# Patient Record
Sex: Male | Born: 1935 | Marital: Married
Health system: Southern US, Community
[De-identification: ages and names within clinical notes are randomized; demographics above are authoritative.]

## PROBLEM LIST (undated history)

## (undated) DIAGNOSIS — I5023 Acute on chronic systolic (congestive) heart failure: Secondary | ICD-10-CM

## (undated) DIAGNOSIS — J449 Chronic obstructive pulmonary disease, unspecified: Secondary | ICD-10-CM

## (undated) DIAGNOSIS — N189 Chronic kidney disease, unspecified: Secondary | ICD-10-CM

## (undated) DIAGNOSIS — I10 Essential (primary) hypertension: Secondary | ICD-10-CM

## (undated) DIAGNOSIS — I255 Ischemic cardiomyopathy: Secondary | ICD-10-CM

## (undated) DIAGNOSIS — I214 Non-ST elevation (NSTEMI) myocardial infarction: Secondary | ICD-10-CM

## (undated) DIAGNOSIS — M1990 Unspecified osteoarthritis, unspecified site: Secondary | ICD-10-CM

## (undated) DIAGNOSIS — Z930 Tracheostomy status: Secondary | ICD-10-CM

## (undated) DIAGNOSIS — N400 Enlarged prostate without lower urinary tract symptoms: Secondary | ICD-10-CM

## (undated) DIAGNOSIS — E785 Hyperlipidemia, unspecified: Secondary | ICD-10-CM

---

## 2014-07-20 ENCOUNTER — Institutional Professional Consult (permissible substitution)
Admission: AD | Admit: 2014-07-20 | Discharge: 2014-08-27 | Disposition: E | Payer: Medicare Other | Source: Ambulatory Visit | Attending: Internal Medicine | Admitting: Internal Medicine

## 2014-07-20 ENCOUNTER — Other Ambulatory Visit (HOSPITAL_COMMUNITY): Payer: Self-pay

## 2014-07-20 DIAGNOSIS — I469 Cardiac arrest, cause unspecified: Secondary | ICD-10-CM

## 2014-07-20 DIAGNOSIS — J9690 Respiratory failure, unspecified, unspecified whether with hypoxia or hypercapnia: Secondary | ICD-10-CM

## 2014-07-20 DIAGNOSIS — I639 Cerebral infarction, unspecified: Secondary | ICD-10-CM

## 2014-07-20 DIAGNOSIS — R198 Other specified symptoms and signs involving the digestive system and abdomen: Secondary | ICD-10-CM | POA: Insufficient documentation

## 2014-07-20 DIAGNOSIS — Z931 Gastrostomy status: Secondary | ICD-10-CM

## 2014-07-20 DIAGNOSIS — Z789 Other specified health status: Secondary | ICD-10-CM

## 2014-07-20 DIAGNOSIS — R509 Fever, unspecified: Secondary | ICD-10-CM

## 2014-07-20 DIAGNOSIS — I509 Heart failure, unspecified: Secondary | ICD-10-CM

## 2014-07-20 HISTORY — DX: Acute on chronic systolic (congestive) heart failure: I50.23

## 2014-07-20 HISTORY — DX: Ischemic cardiomyopathy: I25.5

## 2014-07-20 HISTORY — DX: Non-ST elevation (NSTEMI) myocardial infarction: I21.4

## 2014-07-20 HISTORY — DX: Chronic obstructive pulmonary disease, unspecified: J44.9

## 2014-07-20 HISTORY — DX: Benign prostatic hyperplasia without lower urinary tract symptoms: N40.0

## 2014-07-20 HISTORY — DX: Chronic kidney disease, unspecified: N18.9

## 2014-07-20 HISTORY — DX: Essential (primary) hypertension: I10

## 2014-07-20 HISTORY — DX: Unspecified osteoarthritis, unspecified site: M19.90

## 2014-07-20 HISTORY — DX: Tracheostomy status: Z93.0

## 2014-07-20 HISTORY — DX: Hyperlipidemia, unspecified: E78.5

## 2014-07-21 ENCOUNTER — Encounter: Payer: Self-pay | Admitting: Physician Assistant

## 2014-07-21 ENCOUNTER — Other Ambulatory Visit (HOSPITAL_COMMUNITY): Payer: Self-pay

## 2014-07-21 DIAGNOSIS — I509 Heart failure, unspecified: Secondary | ICD-10-CM | POA: Insufficient documentation

## 2014-07-21 DIAGNOSIS — Z931 Gastrostomy status: Secondary | ICD-10-CM | POA: Insufficient documentation

## 2014-07-21 DIAGNOSIS — I5020 Unspecified systolic (congestive) heart failure: Secondary | ICD-10-CM | POA: Diagnosis not present

## 2014-07-21 DIAGNOSIS — Z789 Other specified health status: Secondary | ICD-10-CM | POA: Insufficient documentation

## 2014-07-21 LAB — CBC WITH DIFFERENTIAL/PLATELET
Basophils Absolute: 0 10*3/uL (ref 0.0–0.1)
Basophils Relative: 0 % (ref 0–1)
EOS ABS: 0.1 10*3/uL (ref 0.0–0.7)
Eosinophils Relative: 1 % (ref 0–5)
HCT: 33.6 % — ABNORMAL LOW (ref 39.0–52.0)
HEMOGLOBIN: 10.5 g/dL — AB (ref 13.0–17.0)
LYMPHS PCT: 13 % (ref 12–46)
Lymphs Abs: 1.6 10*3/uL (ref 0.7–4.0)
MCH: 29.1 pg (ref 26.0–34.0)
MCHC: 31.3 g/dL (ref 30.0–36.0)
MCV: 93.1 fL (ref 78.0–100.0)
MONOS PCT: 12 % (ref 3–12)
Monocytes Absolute: 1.6 10*3/uL — ABNORMAL HIGH (ref 0.1–1.0)
NEUTROS PCT: 74 % (ref 43–77)
Neutro Abs: 9.6 10*3/uL — ABNORMAL HIGH (ref 1.7–7.7)
PLATELETS: 286 10*3/uL (ref 150–400)
RBC: 3.61 MIL/uL — AB (ref 4.22–5.81)
RDW: 16.5 % — ABNORMAL HIGH (ref 11.5–15.5)
WBC: 12.9 10*3/uL — AB (ref 4.0–10.5)

## 2014-07-21 LAB — COMPREHENSIVE METABOLIC PANEL
ALBUMIN: 2.8 g/dL — AB (ref 3.5–5.2)
ALT: 6 U/L (ref 0–53)
AST: 22 U/L (ref 0–37)
Alkaline Phosphatase: 72 U/L (ref 39–117)
Anion gap: 13 (ref 5–15)
BILIRUBIN TOTAL: 0.8 mg/dL (ref 0.3–1.2)
BUN: 70 mg/dL — ABNORMAL HIGH (ref 6–23)
CHLORIDE: 103 meq/L (ref 96–112)
CO2: 35 mEq/L — ABNORMAL HIGH (ref 19–32)
CREATININE: 1.65 mg/dL — AB (ref 0.50–1.35)
Calcium: 8.9 mg/dL (ref 8.4–10.5)
GFR calc Af Amer: 43 mL/min — ABNORMAL LOW (ref >90)
GFR calc non Af Amer: 37 mL/min — ABNORMAL LOW (ref >90)
Glucose, Bld: 92 mg/dL (ref 70–99)
Potassium: 3.7 mEq/L (ref 3.7–5.3)
SODIUM: 151 meq/L — AB (ref 137–147)
Total Protein: 7.1 g/dL (ref 6.0–8.3)

## 2014-07-21 LAB — PHOSPHORUS: Phosphorus: 3.7 mg/dL (ref 2.3–4.6)

## 2014-07-21 LAB — BLOOD GAS, ARTERIAL
Acid-Base Excess: 10.6 mmol/L — ABNORMAL HIGH (ref 0.0–2.0)
Bicarbonate: 34.4 mEq/L — ABNORMAL HIGH (ref 20.0–24.0)
FIO2: 0.4 %
O2 Saturation: 94.2 %
PCO2 ART: 44.4 mmHg (ref 35.0–45.0)
Patient temperature: 98.6
TCO2: 35.8 mmol/L (ref 0–100)
pH, Arterial: 7.502 — ABNORMAL HIGH (ref 7.350–7.450)
pO2, Arterial: 70.7 mmHg — ABNORMAL LOW (ref 80.0–100.0)

## 2014-07-21 LAB — HEMOGLOBIN A1C
Hgb A1c MFr Bld: 5.4 % (ref <5.7)
Mean Plasma Glucose: 108 mg/dL (ref <117)

## 2014-07-21 LAB — PROCALCITONIN: Procalcitonin: 0.18 ng/mL

## 2014-07-21 LAB — LIPID PANEL
CHOLESTEROL: 96 mg/dL (ref 0–200)
HDL: 38 mg/dL — ABNORMAL LOW (ref >39)
LDL Cholesterol: 37 mg/dL (ref 0–99)
Total CHOL/HDL Ratio: 2.5 RATIO
Triglycerides: 103 mg/dL (ref <150)
VLDL: 21 mg/dL (ref 0–40)

## 2014-07-21 LAB — IRON AND TIBC
Iron: 21 ug/dL — ABNORMAL LOW (ref 42–135)
Saturation Ratios: 11 % — ABNORMAL LOW (ref 20–55)
TIBC: 184 ug/dL — ABNORMAL LOW (ref 215–435)
UIBC: 163 ug/dL (ref 125–400)

## 2014-07-21 LAB — CK TOTAL AND CKMB (NOT AT ARMC)
CK, MB: 3.3 ng/mL (ref 0.3–4.0)
RELATIVE INDEX: INVALID (ref 0.0–2.5)
Total CK: 24 U/L (ref 7–232)

## 2014-07-21 LAB — VITAMIN B12: Vitamin B-12: 1042 pg/mL — ABNORMAL HIGH (ref 211–911)

## 2014-07-21 LAB — TROPONIN I: Troponin I: <0.3 ng/mL (ref <0.30)

## 2014-07-21 LAB — TSH: TSH: 8.97 u[IU]/mL — ABNORMAL HIGH (ref 0.350–4.500)

## 2014-07-21 LAB — PROTIME-INR
INR: 1.37 (ref 0.00–1.49)
Prothrombin Time: 17 seconds — ABNORMAL HIGH (ref 11.6–15.2)

## 2014-07-21 LAB — MAGNESIUM: MAGNESIUM: 1.9 mg/dL (ref 1.5–2.5)

## 2014-07-21 LAB — PRO B NATRIURETIC PEPTIDE: PRO B NATRI PEPTIDE: 65464 pg/mL — AB (ref 0–450)

## 2014-07-21 LAB — FOLATE RBC: RBC FOLATE: 978 ng/mL — AB (ref >280)

## 2014-07-21 LAB — T4, FREE: Free T4: 1.21 ng/dL (ref 0.80–1.80)

## 2014-07-21 LAB — FERRITIN: Ferritin: 519 ng/mL — ABNORMAL HIGH (ref 22–322)

## 2014-07-21 MED ORDER — IOHEXOL 300 MG/ML  SOLN
50.0000 mL | Freq: Once | INTRAMUSCULAR | Status: AC | PRN
  Administered 2014-07-21: 50 mL via ORAL

## 2014-07-21 NOTE — Consult Note (Signed)
Name: Luis Thornton MRN: [id number] DOB: 21-Jun-1931    ADMISSION DATE:  07/07/2014 CONSULTATION DATE:  07/21/2014  REFERRING MD :  Shawnie Pons  CHIEF COMPLAINT:  Tracheostomy dependence  BRIEF PATIENT DESCRIPTION: 80 y.o. M with PMH of COPD, ischemic cardiomyopathy, HLD, HTN, CKD who was recently admitted to [facility] 10/16 for NSTEMI and went on to have CABG 11/20.  Hospitalization complicated by acute respiratory failure requiring multiple intubations and eventual trach 11/13 as well as CKD requiring HD.  Transferred to [facility] 11/24, doing well on 35% ATC.  PCCM consulted.  SIGNIFICANT EVENTS  10/16 - admitted to [facility] 10/20 - CABG 11/13 - trach / PEG 11/18 - PPM placed 11/24 - transfer to [facility] 11/25 - PCCM consulted  STUDIES:  11/24 CXR >>> CHF  HISTORY OF PRESENT ILLNESS:  Pt is non-verbal; therefore, this HPI is obtained from chart review. Luis Thornton is an 80 y.o. M with PMH of COPD, chronic systolic dysfunction with ischemic cardiomyopathy, HLD, HTN, CKD (on recent HD via temporary catheter), BPH, Arthritis.  He was admitted to [facility] on 10/16 with chest pain and found to have NSTEMI.  He went on to have a CABG 10/20.  Hospitalization was complicated by acute respiratory failure and acute renal failure.  He was intubated twice and failed extubation requiring a 3rd intubation.  He subsequently required tracheostomy and PEG which were performed 11/13.  He had been on HD via temporary catheter for several weeks and was being followed by nephrology in case he were to require long term HD. He had a pacemaker placed on 11/18 for heart failure. Since trach was placed, pt has been able to be weaned off the vent and was doing well on 40% ATC.  On 11/24, he weaned further to 35% and was cleared for discharge from [facility].  Due to all of his medical problems LTAC was required therefore pt was transferred to [facility] on 11/24. PCCM  was consulted for assistance with medical management.  PAST MEDICAL HISTORY :   has no past medical history on file.  has no past surgical history on file. Prior to Admission medications   Not on File   Allergies not on file  FAMILY HISTORY:  family history is not on file. SOCIAL HISTORY:    REVIEW OF SYSTEMS:  Unable to obtain as pt is non-verbal.  SUBJECTIVE:   VITAL SIGNS:  Vitals reviewed, stable.    PHYSICAL EXAMINATION: General: Chronically ill appearing, resting in bed, in NAD. Neuro: Awake, follows commands intermittently, non-verbal.  HEENT: [place]/AT. PERRL, sclerae anicteric.  Trach collar in place. Cardiovascular: RRR, no M/R/G.  Lungs: Diminished, few scattered rhonchi.  On 35% ATC. Abdomen: PEG in place, +BS, soft, NT/ND.  Musculoskeletal: No gross deformities, no edema.  Skin: Intact, warm, no rashes.   Recent Labs Lab 07/21/14 0500  NA 151*  K 3.7  CL 103  CO2 35*  BUN 70*  CREATININE 1.65*  GLUCOSE 92    Recent Labs Lab 07/21/14 0500  HGB 10.5*  HCT 33.6*  WBC 12.9*  PLT 286   Dg Chest Port 1 View  07/13/2014   CLINICAL DATA:  CHF  EXAM: PORTABLE CHEST - 1 VIEW  COMPARISON:  07/18/2014  FINDINGS: Bilateral interstitial and alveolar airspace opacities. Bilateral small pleural effusions. No pneumothorax. Stable cardiomegaly. Prior CABG.  3 lead cardiac pacer. Tracheostomy to with the tip 2.7 cm above the carina. Left subclavian central venous catheter  with the tip projecting along the confluence of the brachiocephalic vein and SVC.  Unremarkable osseous structures.  IMPRESSION: Overall findings concerning for worsening CHF.   Electronically Signed   By: Kathreen Devoid   On: 07/22/2014 16:54   Dg Abd Portable 1v  07/21/2014   CLINICAL DATA:  Percutaneous gastrostomy tube.  EXAM: PORTABLE ABDOMEN - 1 VIEW  COMPARISON:  KUB from yesterday  FINDINGS: Contrast was injected into the percutaneous gastrostomy tube, with contrast outlining of the stomach and  duodenum. Contrast within the colon was noted on the prior study. No evidence of peritoneal leakage.The bowel gas pattern is nonobstructive.  IMPRESSION: Intraluminal percutaneous gastrostomy tube.   Electronically Signed   By: Jorje Guild M.D.   On: 07/21/2014 02:43   Dg Abd Portable 1v  07/03/2014   CLINICAL DATA:  PEG tube.  EXAM: PORTABLE ABDOMEN - 1 VIEW  COMPARISON:  07/18/2014  FINDINGS: Percutaneous gastrostomy tube tip overlaps the left upper quadrant. Cannot confirm intraluminal location with contrast injection.  Oral contrast outlines colon. There is no evidence of bowel obstruction.  Bibasilar lung opacities - there has been recent dedicated chest imaging.  IMPRESSION: Percutaneous gastrostomy tube tip projects over the left upper quadrant.   Electronically Signed   By: Jorje Guild M.D.   On: 07/16/2014 21:10    ASSESSMENT / PLAN:  Chronic respiratory failure s/p tracheostomy 11/13 Tracheostomy Dependence - doing well on 35% ATC COPD without evidence of exacerbation Mild pulmonary edema Small bilateral pleural effusions Recs: Continue ATC as tolerated, NO downsize planned - benefit? He does not speak, PMV unlikely to be beneficial Continue scheduled DuoNebs / PRN Abluterol. No role systemic steroids or abx. Continue lasix gtt per primary, with correction NA free water and close observation crt / BUN Follow CXR intermittently. ABG reviewed, neuro driven alk?  Concerned that his quality of life will be horrible, would consider addressing code status   Montey Hora, [place] Pgr: [phone]  or [phone] 07/21/2014, 10:47 AM   STAFF NOTE: I, Merrie Roof, MD FACP have personally reviewed patient's available data, including medical history, events of note, physical examination and test results as part of my evaluation. I have discussed with resident/NP and other care providers such as pharmacist, RN and RRT. In  addition, I personally evaluated patient and elicited key findings of: limited alertness when I examined him, continue trach collar, monitor sputum, no change in trach for now, no PMV unless able to improve neuro status, correct Na, diuresis as tolerated per renal fxn  Lavon Paganini. Titus Mould, MD, [place] Pgr: [facility] 07/21/2014 3:41 PM '

## 2014-07-22 LAB — BLOOD GAS, ARTERIAL
ACID-BASE EXCESS: 14 mmol/L — AB (ref 0.0–2.0)
Bicarbonate: 38.3 mEq/L — ABNORMAL HIGH (ref 20.0–24.0)
FIO2: 0.35 %
O2 SAT: 96.6 %
PATIENT TEMPERATURE: 98.6
TCO2: 39.7 mmol/L (ref 0–100)
pCO2 arterial: 48.5 mmHg — ABNORMAL HIGH (ref 35.0–45.0)
pH, Arterial: 7.508 — ABNORMAL HIGH (ref 7.350–7.450)
pO2, Arterial: 80.8 mmHg (ref 80.0–100.0)

## 2014-07-22 LAB — CBC
HCT: 33 % — ABNORMAL LOW (ref 39.0–52.0)
HEMOGLOBIN: 10.1 g/dL — AB (ref 13.0–17.0)
MCH: 28.6 pg (ref 26.0–34.0)
MCHC: 30.6 g/dL (ref 30.0–36.0)
MCV: 93.5 fL (ref 78.0–100.0)
Platelets: 274 10*3/uL (ref 150–400)
RBC: 3.53 MIL/uL — ABNORMAL LOW (ref 4.22–5.81)
RDW: 17.1 % — ABNORMAL HIGH (ref 11.5–15.5)
WBC: 11.3 10*3/uL — ABNORMAL HIGH (ref 4.0–10.5)

## 2014-07-22 LAB — BASIC METABOLIC PANEL
ANION GAP: 10 (ref 5–15)
BUN: 66 mg/dL — ABNORMAL HIGH (ref 6–23)
CO2: 39 mEq/L — ABNORMAL HIGH (ref 19–32)
Calcium: 8.6 mg/dL (ref 8.4–10.5)
Chloride: 104 mEq/L (ref 96–112)
Creatinine, Ser: 1.53 mg/dL — ABNORMAL HIGH (ref 0.50–1.35)
GFR calc Af Amer: 47 mL/min — ABNORMAL LOW (ref >90)
GFR, EST NON AFRICAN AMERICAN: 41 mL/min — AB (ref >90)
GLUCOSE: 95 mg/dL (ref 70–99)
POTASSIUM: 3.2 meq/L — AB (ref 3.7–5.3)
SODIUM: 153 meq/L — AB (ref 137–147)

## 2014-07-22 LAB — PHOSPHORUS: PHOSPHORUS: 3.6 mg/dL (ref 2.3–4.6)

## 2014-07-22 LAB — PROTIME-INR
INR: 1.35 (ref 0.00–1.49)
Prothrombin Time: 16.8 seconds — ABNORMAL HIGH (ref 11.6–15.2)

## 2014-07-22 LAB — MAGNESIUM: MAGNESIUM: 1.8 mg/dL (ref 1.5–2.5)

## 2014-07-23 LAB — BASIC METABOLIC PANEL
Anion gap: 11 (ref 5–15)
BUN: 67 mg/dL — AB (ref 6–23)
CHLORIDE: 104 meq/L (ref 96–112)
CO2: 40 meq/L — AB (ref 19–32)
Calcium: 8.7 mg/dL (ref 8.4–10.5)
Creatinine, Ser: 1.46 mg/dL — ABNORMAL HIGH (ref 0.50–1.35)
GFR calc Af Amer: 50 mL/min — ABNORMAL LOW (ref >90)
GFR calc non Af Amer: 43 mL/min — ABNORMAL LOW (ref >90)
GLUCOSE: 105 mg/dL — AB (ref 70–99)
POTASSIUM: 3.2 meq/L — AB (ref 3.7–5.3)
Sodium: 155 mEq/L — ABNORMAL HIGH (ref 137–147)

## 2014-07-23 LAB — VITAMIN D 1,25 DIHYDROXY
Vitamin D 1, 25 (OH)2 Total: 23 pg/mL (ref 18–72)
Vitamin D2 1, 25 (OH)2: <8 pg/mL
Vitamin D3 1, 25 (OH)2: 23 pg/mL

## 2014-07-23 LAB — PROTIME-INR
INR: 1.34 (ref 0.00–1.49)
Prothrombin Time: 16.7 seconds — ABNORMAL HIGH (ref 11.6–15.2)

## 2014-07-24 ENCOUNTER — Other Ambulatory Visit (HOSPITAL_COMMUNITY): Payer: Self-pay

## 2014-07-24 LAB — BASIC METABOLIC PANEL
Anion gap: 8 (ref 5–15)
BUN: 70 mg/dL — AB (ref 6–23)
CHLORIDE: 105 meq/L (ref 96–112)
CO2: 41 mEq/L (ref 19–32)
Calcium: 8.6 mg/dL (ref 8.4–10.5)
Creatinine, Ser: 1.53 mg/dL — ABNORMAL HIGH (ref 0.50–1.35)
GFR calc Af Amer: 47 mL/min — ABNORMAL LOW (ref >90)
GFR calc non Af Amer: 41 mL/min — ABNORMAL LOW (ref >90)
GLUCOSE: 112 mg/dL — AB (ref 70–99)
Potassium: 3.6 mEq/L — ABNORMAL LOW (ref 3.7–5.3)
SODIUM: 154 meq/L — AB (ref 137–147)

## 2014-07-24 LAB — URINE MICROSCOPIC-ADD ON

## 2014-07-24 LAB — URINALYSIS, ROUTINE W REFLEX MICROSCOPIC
Bilirubin Urine: NEGATIVE
GLUCOSE, UA: NEGATIVE mg/dL
Ketones, ur: NEGATIVE mg/dL
LEUKOCYTES UA: NEGATIVE
NITRITE: NEGATIVE
PH: 6 (ref 5.0–8.0)
Protein, ur: NEGATIVE mg/dL
SPECIFIC GRAVITY, URINE: 1.011 (ref 1.005–1.030)
Urobilinogen, UA: 1 mg/dL (ref 0.0–1.0)

## 2014-07-24 LAB — CBC
HEMATOCRIT: 32 % — AB (ref 39.0–52.0)
HEMOGLOBIN: 9.7 g/dL — AB (ref 13.0–17.0)
MCH: 28.4 pg (ref 26.0–34.0)
MCHC: 30.3 g/dL (ref 30.0–36.0)
MCV: 93.8 fL (ref 78.0–100.0)
Platelets: 217 10*3/uL (ref 150–400)
RBC: 3.41 MIL/uL — AB (ref 4.22–5.81)
RDW: 17.9 % — ABNORMAL HIGH (ref 11.5–15.5)
WBC: 12.9 10*3/uL — ABNORMAL HIGH (ref 4.0–10.5)

## 2014-07-24 LAB — PROTIME-INR
INR: 1.39 (ref 0.00–1.49)
Prothrombin Time: 17.2 seconds — ABNORMAL HIGH (ref 11.6–15.2)

## 2014-07-25 ENCOUNTER — Other Ambulatory Visit (HOSPITAL_COMMUNITY): Payer: Self-pay

## 2014-07-25 LAB — BASIC METABOLIC PANEL
Anion gap: 9 (ref 5–15)
BUN: 71 mg/dL — AB (ref 6–23)
CO2: 43 mEq/L (ref 19–32)
Calcium: 8.5 mg/dL (ref 8.4–10.5)
Chloride: 102 mEq/L (ref 96–112)
Creatinine, Ser: 1.49 mg/dL — ABNORMAL HIGH (ref 0.50–1.35)
GFR calc Af Amer: 49 mL/min — ABNORMAL LOW (ref >90)
GFR, EST NON AFRICAN AMERICAN: 42 mL/min — AB (ref >90)
GLUCOSE: 132 mg/dL — AB (ref 70–99)
POTASSIUM: 3.2 meq/L — AB (ref 3.7–5.3)
Sodium: 154 mEq/L — ABNORMAL HIGH (ref 137–147)

## 2014-07-25 LAB — CBC WITH DIFFERENTIAL/PLATELET
BASOS PCT: 1 % (ref 0–1)
Basophils Absolute: 0.1 10*3/uL (ref 0.0–0.1)
EOS ABS: 0.3 10*3/uL (ref 0.0–0.7)
Eosinophils Relative: 2 % (ref 0–5)
HEMATOCRIT: 32.8 % — AB (ref 39.0–52.0)
HEMOGLOBIN: 9.9 g/dL — AB (ref 13.0–17.0)
Lymphocytes Relative: 19 % (ref 12–46)
Lymphs Abs: 2.6 10*3/uL (ref 0.7–4.0)
MCH: 28.4 pg (ref 26.0–34.0)
MCHC: 30.2 g/dL (ref 30.0–36.0)
MCV: 94.3 fL (ref 78.0–100.0)
MONO ABS: 1.2 10*3/uL — AB (ref 0.1–1.0)
MONOS PCT: 9 % (ref 3–12)
Neutro Abs: 9.6 10*3/uL — ABNORMAL HIGH (ref 1.7–7.7)
Neutrophils Relative %: 69 % (ref 43–77)
Platelets: 168 10*3/uL (ref 150–400)
RBC: 3.48 MIL/uL — AB (ref 4.22–5.81)
RDW: 17.8 % — ABNORMAL HIGH (ref 11.5–15.5)
WBC: 13.7 10*3/uL — ABNORMAL HIGH (ref 4.0–10.5)

## 2014-07-25 LAB — PROTIME-INR
INR: 1.42 (ref 0.00–1.49)
Prothrombin Time: 17.5 seconds — ABNORMAL HIGH (ref 11.6–15.2)

## 2014-07-25 LAB — PROCALCITONIN: PROCALCITONIN: 0.15 ng/mL

## 2014-07-26 ENCOUNTER — Other Ambulatory Visit (HOSPITAL_COMMUNITY): Payer: Self-pay

## 2014-07-26 DIAGNOSIS — R194 Change in bowel habit: Secondary | ICD-10-CM

## 2014-07-26 DIAGNOSIS — I469 Cardiac arrest, cause unspecified: Secondary | ICD-10-CM

## 2014-07-26 DIAGNOSIS — I5021 Acute systolic (congestive) heart failure: Secondary | ICD-10-CM

## 2014-07-26 DIAGNOSIS — R198 Other specified symptoms and signs involving the digestive system and abdomen: Secondary | ICD-10-CM | POA: Insufficient documentation

## 2014-07-26 LAB — CBC WITH DIFFERENTIAL/PLATELET
BASOS ABS: 0 10*3/uL (ref 0.0–0.1)
Basophils Relative: 0 % (ref 0–1)
Eosinophils Absolute: 0 10*3/uL (ref 0.0–0.7)
Eosinophils Relative: 0 % (ref 0–5)
HEMATOCRIT: 39.6 % (ref 39.0–52.0)
Hemoglobin: 12.1 g/dL — ABNORMAL LOW (ref 13.0–17.0)
LYMPHS ABS: 2 10*3/uL (ref 0.7–4.0)
Lymphocytes Relative: 12 % (ref 12–46)
MCH: 29.2 pg (ref 26.0–34.0)
MCHC: 30.6 g/dL (ref 30.0–36.0)
MCV: 95.4 fL (ref 78.0–100.0)
Monocytes Absolute: 0.9 10*3/uL (ref 0.1–1.0)
Monocytes Relative: 5 % (ref 3–12)
NEUTROS ABS: 13.8 10*3/uL — AB (ref 1.7–7.7)
Neutrophils Relative %: 83 % — ABNORMAL HIGH (ref 43–77)
PLATELETS: 158 10*3/uL (ref 150–400)
RBC: 4.15 MIL/uL — ABNORMAL LOW (ref 4.22–5.81)
RDW: 17.7 % — AB (ref 11.5–15.5)
WBC: 16.8 10*3/uL — ABNORMAL HIGH (ref 4.0–10.5)

## 2014-07-26 LAB — COMPREHENSIVE METABOLIC PANEL
ALBUMIN: 2.3 g/dL — AB (ref 3.5–5.2)
ALT: 7 U/L (ref 0–53)
AST: 28 U/L (ref 0–37)
Alkaline Phosphatase: 56 U/L (ref 39–117)
Anion gap: 11 (ref 5–15)
BUN: 74 mg/dL — ABNORMAL HIGH (ref 6–23)
CO2: 39 mEq/L — ABNORMAL HIGH (ref 19–32)
Calcium: 8.6 mg/dL (ref 8.4–10.5)
Chloride: 102 mEq/L (ref 96–112)
Creatinine, Ser: 1.5 mg/dL — ABNORMAL HIGH (ref 0.50–1.35)
GFR calc Af Amer: 49 mL/min — ABNORMAL LOW (ref >90)
GFR calc non Af Amer: 42 mL/min — ABNORMAL LOW (ref >90)
Glucose, Bld: 110 mg/dL — ABNORMAL HIGH (ref 70–99)
POTASSIUM: 4.1 meq/L (ref 3.7–5.3)
SODIUM: 152 meq/L — AB (ref 137–147)
Total Bilirubin: 1 mg/dL (ref 0.3–1.2)
Total Protein: 7.2 g/dL (ref 6.0–8.3)

## 2014-07-26 LAB — BLOOD GAS, ARTERIAL
Acid-Base Excess: 9.7 mmol/L — ABNORMAL HIGH (ref 0.0–2.0)
BICARBONATE: 34.8 meq/L — AB (ref 20.0–24.0)
FIO2: 0.98 %
O2 Saturation: 92.7 %
PATIENT TEMPERATURE: 98.6
PCO2 ART: 58.1 mmHg — AB (ref 35.0–45.0)
TCO2: 36.6 mmol/L (ref 0–100)
pH, Arterial: 7.395 (ref 7.350–7.450)
pO2, Arterial: 72.8 mmHg — ABNORMAL LOW (ref 80.0–100.0)

## 2014-07-26 LAB — BASIC METABOLIC PANEL
Anion gap: 22 — ABNORMAL HIGH (ref 5–15)
BUN: 87 mg/dL — ABNORMAL HIGH (ref 6–23)
CHLORIDE: 96 meq/L (ref 96–112)
CO2: 32 mEq/L (ref 19–32)
Calcium: 9.4 mg/dL (ref 8.4–10.5)
Creatinine, Ser: 2.23 mg/dL — ABNORMAL HIGH (ref 0.50–1.35)
GFR, EST AFRICAN AMERICAN: 30 mL/min — AB (ref >90)
GFR, EST NON AFRICAN AMERICAN: 26 mL/min — AB (ref >90)
Glucose, Bld: 58 mg/dL — ABNORMAL LOW (ref 70–99)
POTASSIUM: 5.6 meq/L — AB (ref 3.7–5.3)
SODIUM: 150 meq/L — AB (ref 137–147)

## 2014-07-26 LAB — PHOSPHORUS: Phosphorus: 4.6 mg/dL (ref 2.3–4.6)

## 2014-07-26 LAB — CBC
HCT: 37.2 % — ABNORMAL LOW (ref 39.0–52.0)
Hemoglobin: 10.8 g/dL — ABNORMAL LOW (ref 13.0–17.0)
MCH: 28.1 pg (ref 26.0–34.0)
MCHC: 29 g/dL — ABNORMAL LOW (ref 30.0–36.0)
MCV: 96.9 fL (ref 78.0–100.0)
PLATELETS: 109 10*3/uL — AB (ref 150–400)
RBC: 3.84 MIL/uL — ABNORMAL LOW (ref 4.22–5.81)
RDW: 18.3 % — ABNORMAL HIGH (ref 11.5–15.5)
WBC: 12.3 10*3/uL — AB (ref 4.0–10.5)

## 2014-07-26 LAB — URINE CULTURE
COLONY COUNT: NO GROWTH
CULTURE: NO GROWTH

## 2014-07-26 LAB — MAGNESIUM: MAGNESIUM: 2.2 mg/dL (ref 1.5–2.5)

## 2014-07-26 LAB — PROTIME-INR
INR: 1.06 (ref 0.00–1.49)
Prothrombin Time: 13.9 seconds (ref 11.6–15.2)

## 2014-07-26 LAB — TROPONIN I: TROPONIN I: 0.52 ng/mL — AB (ref <0.30)

## 2014-07-26 LAB — LACTIC ACID, PLASMA: LACTIC ACID, VENOUS: 10.9 mmol/L — AB (ref 0.5–2.2)

## 2014-07-26 LAB — PROCALCITONIN: Procalcitonin: 2.42 ng/mL

## 2014-07-26 LAB — PRO B NATRIURETIC PEPTIDE: Pro B Natriuretic peptide (BNP): 50664 pg/mL — ABNORMAL HIGH (ref 0–450)

## 2014-07-26 NOTE — Progress Notes (Signed)
Name: Luis Thornton MRN: [id number] DOB: [birth year]    ADMISSION DATE:  09-05-13 CONSULTATION DATE:  07/21/2014  REFERRING MD :  Arnette NorrisBarakat  CHIEF COMPLAINT:  Tracheostomy dependence  BRIEF PATIENT DESCRIPTION: 80 y.o. M with PMH of COPD, ischemic cardiomyopathy, HLD, HTN, CKD who was [facility] 10/16 for NSTEMI and went on to have CABG 11/20.  Hospitalization complicated by acute respiratory failure requiring multiple intubations and eventual trach 11/13 as well as CKD requiring HD.  [facility], doing well on 35% ATC.  PCCM consulted.  SIGNIFICANT EVENTS  10/16 - admitted to [facility] 10/20 - CABG 11/13 - trach / PEG 11/18 - PPM placed 11/24 - [facility] P[facility]M consulted  STUDIES:  11/24 CXR >>> CHF   SUBJECTIVE:   VITAL SIGNS:  Vitals reviewed, stable.    PHYSICAL EXAMINATION: General: Chronically ill appearing, resting in bed, in NAD. Neuro: Awake, non-verbal or interactive  HEENT: [place]/AT. PERRL, sclerae anicteric.  Trach collar in place. Cardiovascular: RRR, no M/R/G.  Lungs: Diminished,  RR increased. On 35% ATC. Abdomen: PEG in place, +BS, soft, NT/ND.  Musculoskeletal: No gross deformities, no edema.  Skin: Intact, warm, no rashes.   Recent Labs Lab 07/24/14 0640 07/25/14 0630 07/26/14 0700  NA 154* 154* 152*  K 3.6* 3.2* 4.1  CL 105 102 102  CO2 41* 43* 39*  BUN 70* 71* 74*  CREATININE 1.53* 1.49* 1.50*  GLUCOSE 112* 132* 110*    Recent Labs Lab 07/24/14 0640 07/25/14 0630 07/26/14 0700  HGB 9.7* 9.9* 12.1*  HCT 32.0* 32.8* 39.6  WBC 12.9* 13.7* 16.8*  PLT 217 168 158   Ct Head Wo Contrast  07/25/2014   CLINICAL DATA:  Altered mental status.  Cerebrovascular accident.  EXAM: CT HEAD WITHOUT CONTRAST  TECHNIQUE: Contiguous axial images were obtained from the base of the skull through the vertex without intravenous contrast.  COMPARISON:  CT scan of July 02, 2014.  FINDINGS: Bony calvarium appears intact. Diffuse cortical atrophy is noted. Mild chronic ischemic white matter disease is noted. No mass effect or midline shift is noted. Ventricular size is within normal limits. There is no evidence of mass lesion, hemorrhage or acute infarction.  IMPRESSION: Stable diffuse cortical atrophy. Mild chronic ischemic white matter disease. No acute intracranial abnormality seen.   Electronically Signed   By: Roque LiasJames  Green M.D.   On: 07/25/2014 18:27   Dg Chest Port 1 View  07/24/2014   CLINICAL DATA:  Respiratory failure.  EXAM: PORTABLE CHEST - 1 VIEW  COMPARISON:  July 20, 2014.  FINDINGS: Stable cardiomegaly. Tracheostomy tube is in grossly good position. Left-sided pacemaker is unchanged in position. Bilateral perihilar and airspace opacities noted on prior exam are significantly improved compared to prior exam. However, residual mild pleural effusions remain with probable bibasilar opacities most consistent with subsegmental atelectasis or edema. No pneumothorax is noted.  IMPRESSION: Significantly improved bilateral lung opacities are noted most consistent with improving edema. Mild residual pleural effusions remain as well as mild bibasilar opacities consistent with edema or atelectasis.   Electronically Signed   By: Roque LiasJames  Green M.D.   On: 07/24/2014 14:03    ASSESSMENT / PLAN:  Chronic respiratory failure s/p tracheostomy 11/13 Tracheostomy Dependence - doing well on 35% ATC COPD without evidence of exacerbation Mild pulmonary edema Small bilateral pleural effusions Encephalopathy  Hypernatremia   Discussion Does not interact. MS would prevent decannulation. Na levels slowly  improving   Recs: Continue ATC as tolerated, NO downsize planned - benefit? He does not speak, PMV unlikely to be beneficial Continue scheduled DuoNebs / PRN Abluterol. No role systemic steroids or abx. Continue lasix gtt per primary, with correction NA free water and  close observation crt / BUN Follow CXR intermittently.   07/26/2014 8:33 AM   STAFF NOTE: Cindi CarbonI, Daniel Feinstein, MD FACP have personally reviewed patient's available data, including medical history, events of note, physical examination and test results as part of my evaluation. I have discussed with resident/NP and other care providers such as pharmacist, RN and RRT. In addition, I personally evaluated patient and elicited key findings of: need neuro assessment, prognosis for quality?, continued trach collar trials, likely vent again would be medically ineffective in future if needed, need EOL discussions likely, no downsize in his future unless neuro improved, also follow secretion status  Mcarthur RossettiDaniel J. Tyson AliasFeinstein, MD, FACP Pgr: [phone] [facility] 07/26/2014 12:44 PM

## 2014-07-26 NOTE — Progress Notes (Signed)
   PATIENT NAME: Luis CroonBlaise Thornton MEDICAL RECORD NUMBER: [id number] Birthday: [birth year]  Age: 80 y.o. Admit Date: 07/09/2014  Provider: Dr Kalman ShanMurali Olivier Frayre     Indication:  Responded to code blue call. Patient had PEA arrest for 15 minutes ending 30 min later. Went to see patient and as soon as I arrived he went to PEA arrest. RApid response nurse at bedside.    Hx given by bedside nurses and other care providers. He is s/p [facility] for > [facility] and came in with Trach and PEG to [facility] 07/24/2014. He has CRI, makes urine and admitted unresponsive. After firs CPR, sternal wound dehisced and patient hypoxemic and gastric contents in tracheostomy  For 2nd PEA below  Technical Description:   CPR performance duration: 5 minutes  Was defibrillation or cardioversion used ? no  Was external pacer placed ? no  Was patient intubated pre/post CPR ? S/p trach and bagged  Was transvenous pacer placed ? no  Medications Administered Include      Yes/no Amiodarone no  Atropin no  Calcium no  Epinephrine yes  Lidocaine no  Magnesium no  Norepinephrine Will be started post cpr  Phenylephrine no  Sodium bicarbonate yes  Vasopression Yes x1   Evaluation  Final Status - Was patient successfully resuscitated ? YES   If successfully resuscitated - what is current rhythm ? Unclear  If successfully resuscitated - what is current hemodynamic status ? In shock  Miscellaneous Information Pos CPR - spoke to daughter. Updated. She has confirmed DNAR moving forward. She says based on patient living will palliation best but to keep alive till she and patient wife show up  Will start levophed and bicarb gtt  IF arrests again, no cpr  Dr. Kalman ShanMurali Brittni Hult, M.D., [facility] Center[facility].C.C.P Pulmonary and Critical Care Medicine Staff Physician [facility] [place] [facility] Pager: [phone], If no answer or between  15:00h - 7:00h: call [phone]  07/26/2014 10:24 PM

## 2014-07-27 LAB — BLOOD GAS, ARTERIAL
ACID-BASE EXCESS: 2 mmol/L (ref 0.0–2.0)
BICARBONATE: 25.9 meq/L — AB (ref 20.0–24.0)
FIO2: 1 %
MECHVT: 500 mL
O2 Saturation: 97.8 %
PCO2 ART: 38.9 mmHg (ref 35.0–45.0)
PEEP: 5 cmH2O
PH ART: 7.438 (ref 7.350–7.450)
Patient temperature: 98.6
RATE: 12 resp/min
TCO2: 27.1 mmol/L (ref 0–100)
pO2, Arterial: 103 mmHg — ABNORMAL HIGH (ref 80.0–100.0)

## 2014-07-27 MED FILL — Medication: Qty: 1 | Status: AC

## 2014-07-27 DEATH — deceased

## 2014-08-01 LAB — CULTURE, BLOOD (ROUTINE X 2)
CULTURE: NO GROWTH
Culture: NO GROWTH

## 2014-08-27 NOTE — Progress Notes (Signed)
CODE BLUE NOTE  Patient Name: Luis CroonBlaise Mcclees   MRN: [id number]   Date of Birth/ Sex: April 21, 1931 , male      Admission Date: 07/08/2014  Attending Provider: Elnora MorrisonAhmad B Barakat, MD  Primary Diagnosis: <principal problem not specified>    Indication: Pt was in his usual state of health until this PM, when he was noted to be bradycardic. Code blue was subsequently called. At the time of arrival on scene, ACLS protocol was underway.    Technical Description:  - CPR performance duration:  2115-2123 (8 minutes)    - Was defibrillation or cardioversion used? No   - Was external pacer placed? No  - Was patient intubated pre/post CPR? No (Trach in Place through which ventilation occured)    Medications Administered: Y = Yes; Blank = No Amiodarone    Atropine  Y  Calcium    Epinephrine  Y  Lidocaine    Magnesium    Norepinephrine    Phenylephrine    Sodium bicarbonate  Y  Vasopressin      Post CPR evaluation:  - Final Status - Was patient successfully resuscitated ? Yes - What is current hemodynamic status? Stable   Miscellaneous Information:  - Labs sent, including: CBC, BMET, Lactic Acid, ABG, Troponins  - Primary team notified?  Yes  - Family Notified? Yes  - Additional notes/ transfer status:  Primary [facility] team to follow results of labs and further manage. CTS consulted due to open  wound over sternum.     Physicians Responding to Code Blue: Dr. Zada GirtKazibwe, Internal Medicine PGY-3 Garry Heateraleigh Cece Milhouse, DO, [facility] Center[facility] PGY-1   [facility] HospitalRaleigh N Sherice Ijames, DO  08/23/2014, 1:18 AM

## 2014-08-27 DEATH — deceased

## 2016-02-23 IMAGING — CR DG ABD PORTABLE 1V
1 series · 1 of 1 positions shown · non-contrast
Comparison: Single view of the abdomen 07/21/2014 and 07/20/2014.

CLINICAL DATA: Decreased bowel sounds.

EXAM:
PORTABLE ABDOMEN - 1 VIEW

[AP]
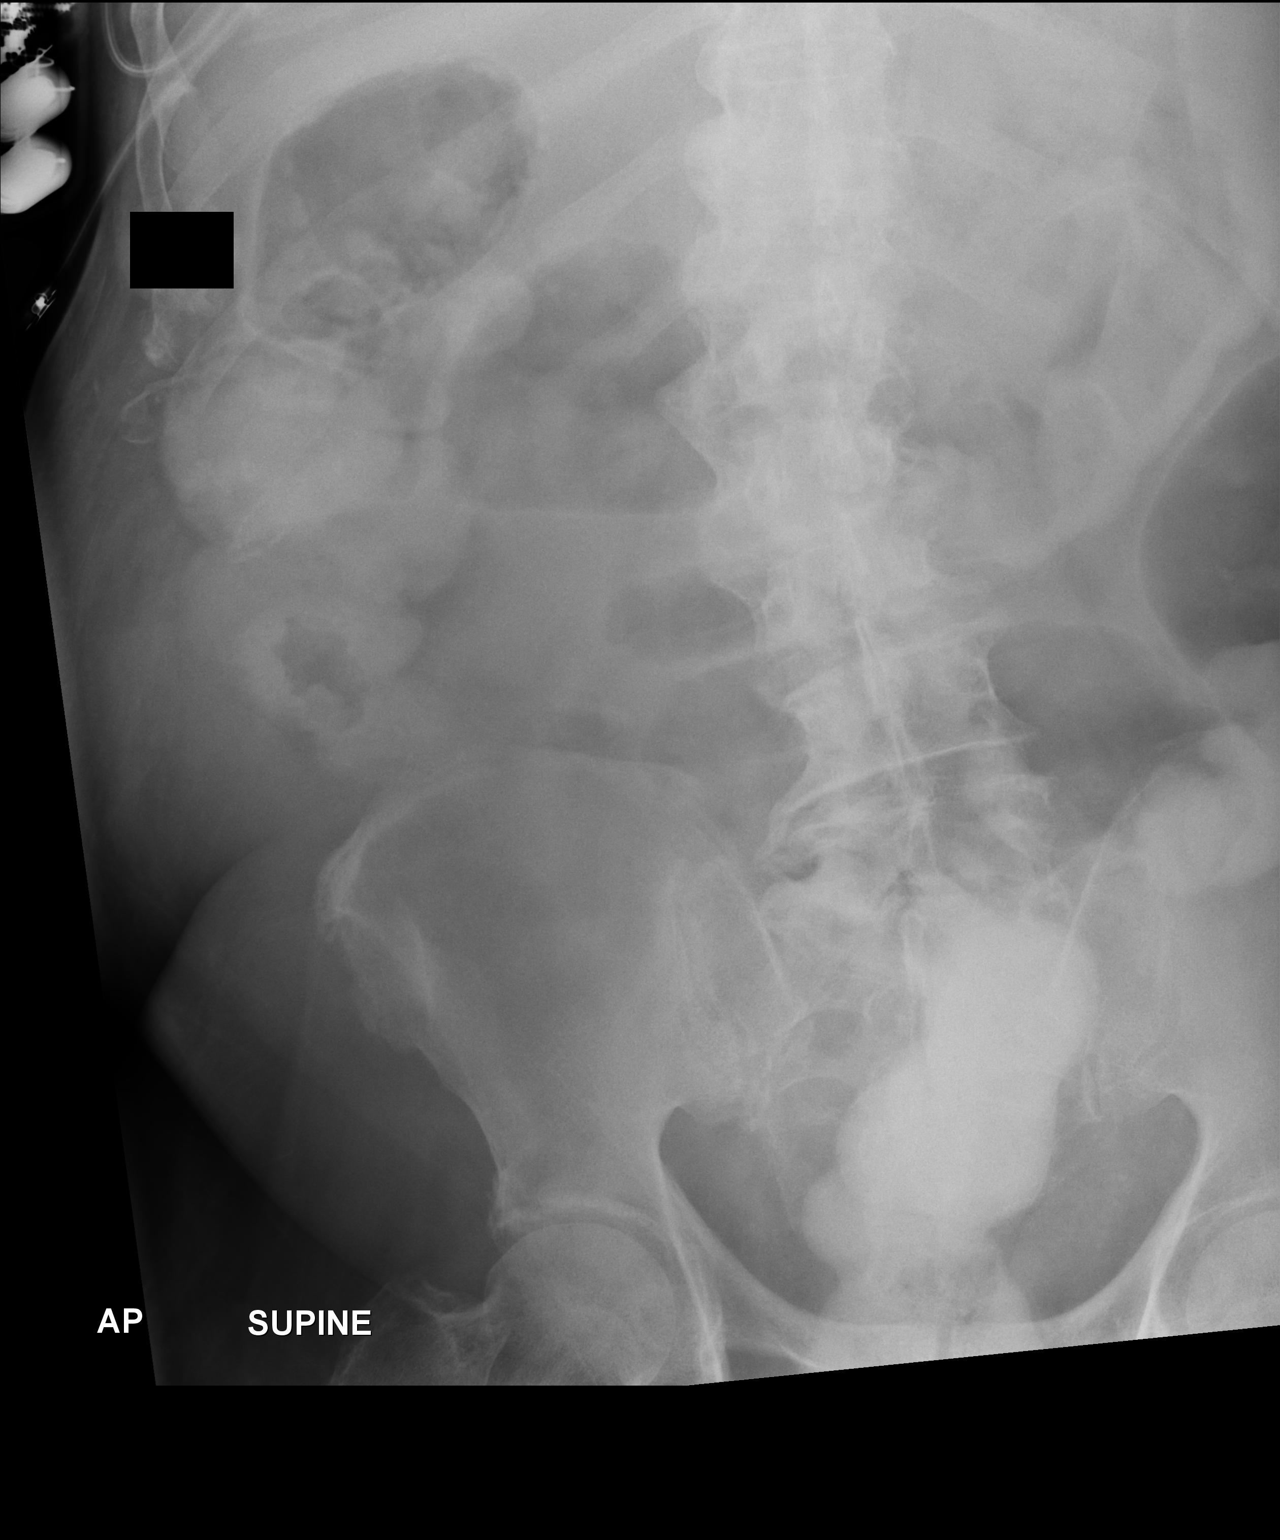

[1 of 1 positions shown; findings below may reference images not displayed]

FINDINGS: A portion of the left abdomen is off the margin of the film. There
is mild distention of the colon. Contrast material is seen in the
colon but decreased in volume since the most recent examination. No
small bowel dilatation is identified.
IMPRESSION: Mild distention of the colon suggesting ileus.
